# Patient Record
Sex: Male | Born: 1985 | Race: Black or African American | Hispanic: No | Marital: Married | Smoking: Never smoker
Health system: Southern US, Community
[De-identification: ages and names within clinical notes are randomized; demographics above are authoritative.]

---
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
## 2006-06-23 ENCOUNTER — Emergency Department (HOSPITAL_COMMUNITY): Admission: EM | Admit: 2006-06-23 | Discharge: 2006-06-23 | Payer: Self-pay | Admitting: Family Medicine

## 2006-12-20 ENCOUNTER — Emergency Department (HOSPITAL_COMMUNITY): Admission: EM | Admit: 2006-12-20 | Discharge: 2006-12-21 | Payer: Self-pay | Admitting: Emergency Medicine

## 2007-12-10 ENCOUNTER — Emergency Department (HOSPITAL_COMMUNITY): Admission: EM | Admit: 2007-12-10 | Discharge: 2007-12-10 | Payer: Self-pay | Admitting: Emergency Medicine

## 2008-08-04 ENCOUNTER — Emergency Department (HOSPITAL_COMMUNITY): Admission: EM | Admit: 2008-08-04 | Discharge: 2008-08-05 | Payer: Self-pay | Admitting: Emergency Medicine

## 2010-03-03 IMAGING — CR DG CHEST 2V
2 series · 2 of 2 positions shown · non-contrast
Comparison: None

CLINICAL DATA: Fever.  Cough.

CHEST - 2 VIEW

[w chest pa]
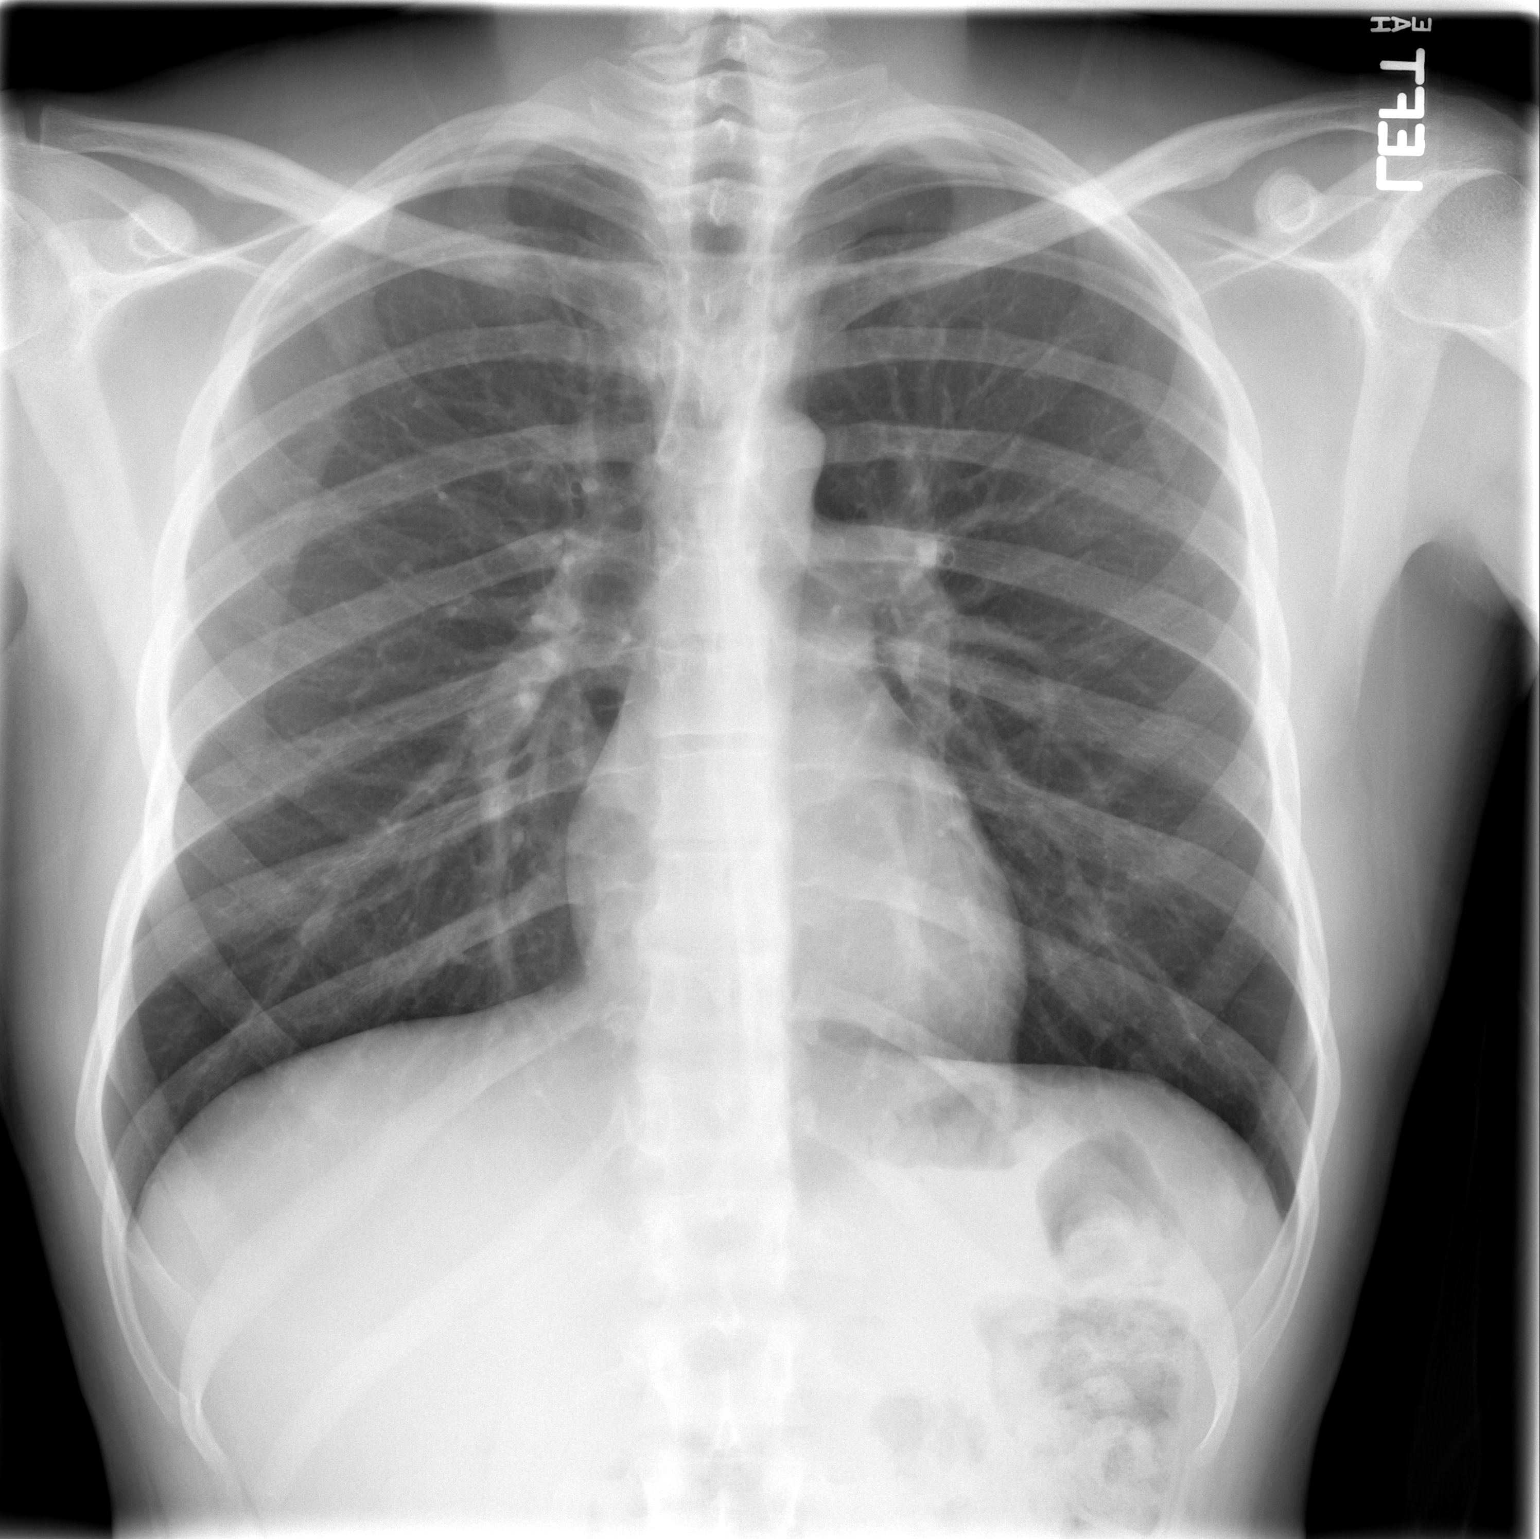

[w chest lat]
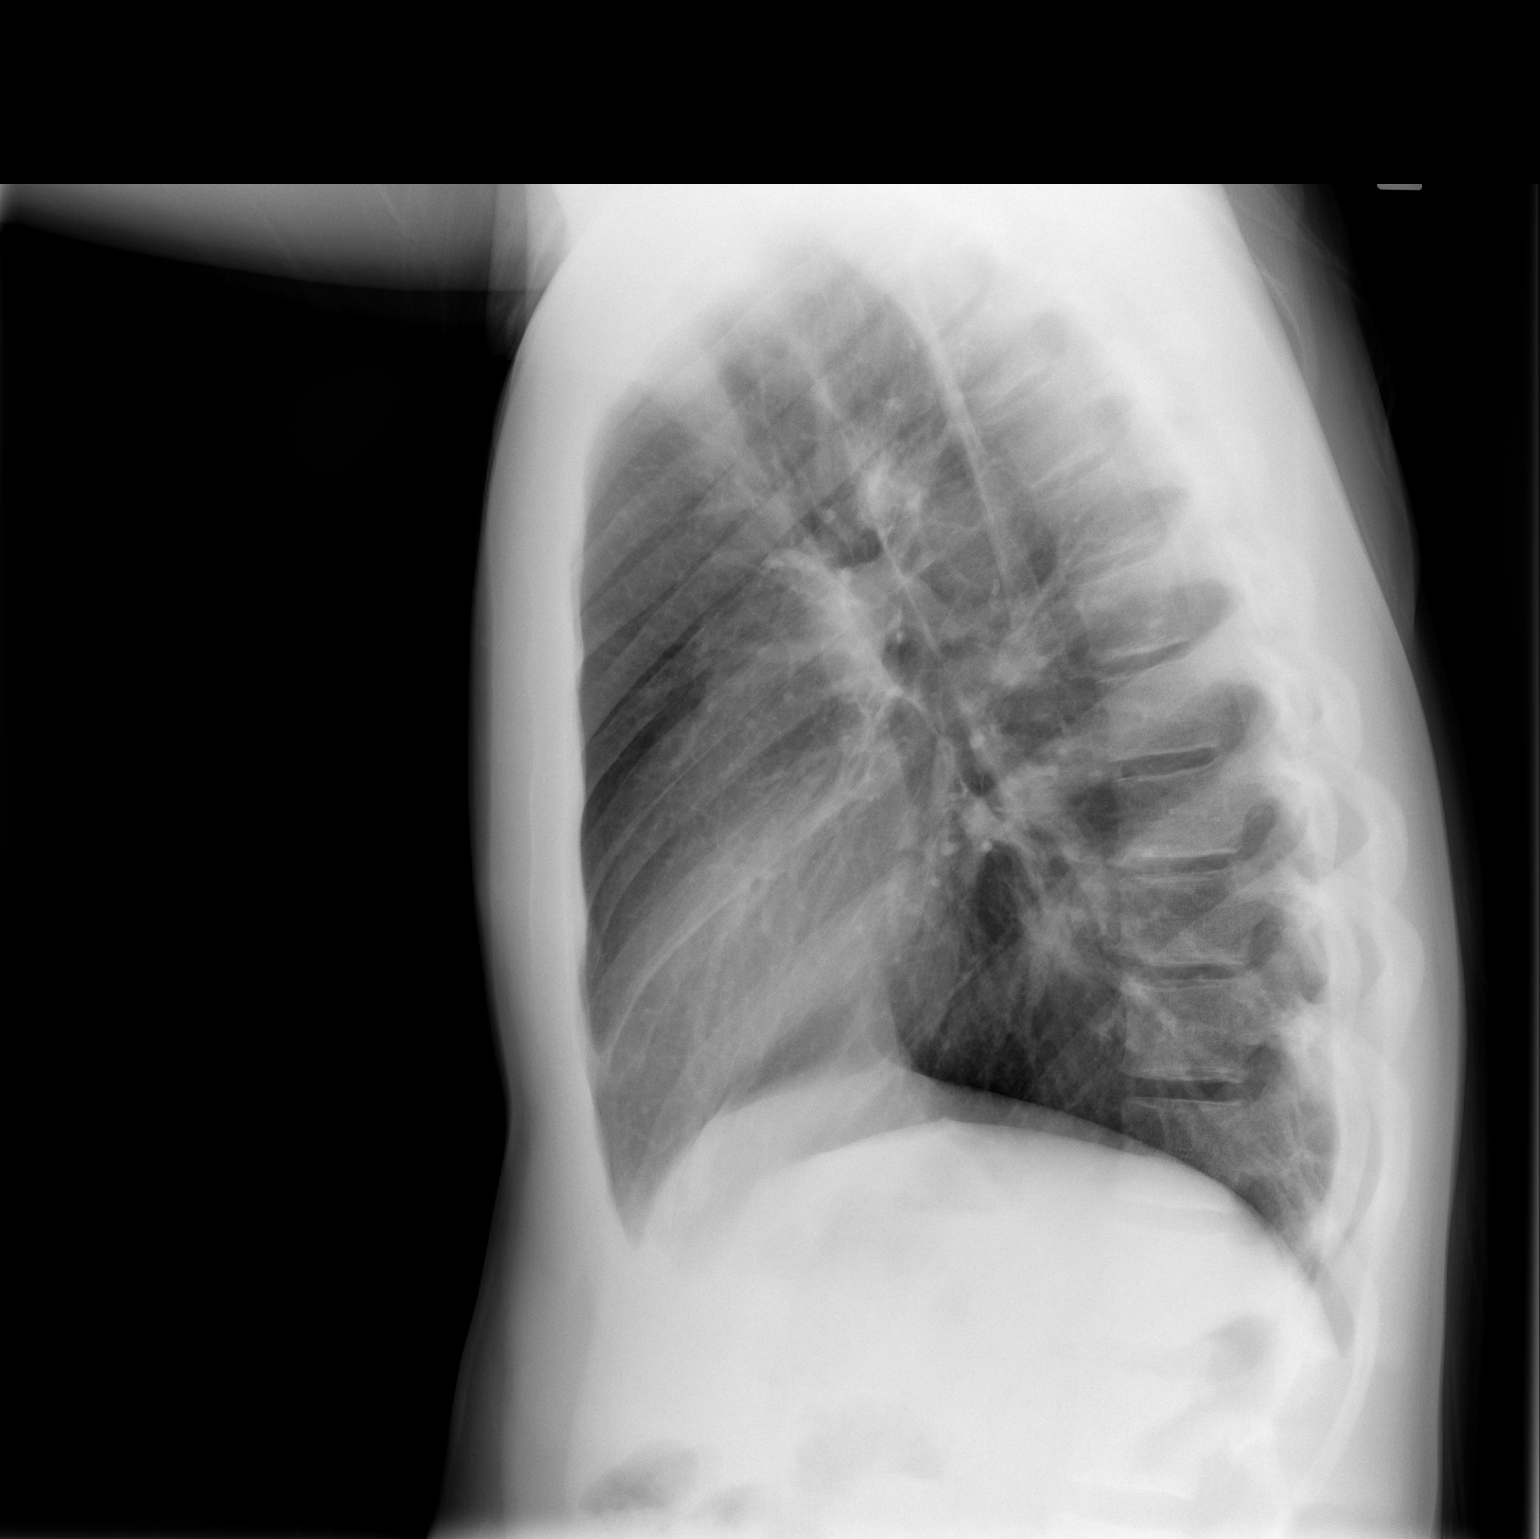

[2 of 2 positions shown; findings below may reference images not displayed]

FINDINGS: Heart size is normal.  The mediastinum is normal.  The
lungs are clear.  The vascularity is normal.  No effusions.  No
bony abnormalities
IMPRESSION: Normal chest

## 2012-06-11 ENCOUNTER — Ambulatory Visit: Payer: Self-pay | Admitting: Family Medicine

## 2012-06-11 VITALS — BP 135/70 | HR 56 | Temp 97.5°F | Resp 18 | Ht 72.5 in | Wt 169.0 lb

## 2012-06-11 DIAGNOSIS — Z0000 Encounter for general adult medical examination without abnormal findings: Secondary | ICD-10-CM

## 2012-06-11 DIAGNOSIS — Z0289 Encounter for other administrative examinations: Secondary | ICD-10-CM

## 2012-06-11 NOTE — Progress Notes (Signed)
[facility] [address], [place] [address] [phone]  Date:  06/11/2012   Name:  Steven Crawford   DOB:  12-20-1985   MRN:  [id number]  PCP:  No primary provider on file.    Chief Complaint: Annual Exam   History of Present Illness:  Steven Crawford is a 27 y.o. 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  He is generally quite healthy, on no chronic meds.  He has never had surgery.  He exercises quite a bit- most days of the week- by running and doing martial arts.  No problems with CP, SOB, or syncope.  He has no concerns about performing the PE physical requirements.  He has not noted any hernia  There is no problem list on file for this patient.   No past medical history on file.  No past surgical history on file.  History  Substance Use Topics  . Smoking status: Never Smoker   . Smokeless tobacco: Not on file  . Alcohol Use: Yes    No family history on file.  No Known Allergies  Medication list has been reviewed and updated.  No current outpatient prescriptions on file prior to visit.    Review of Systems:  As per HPI- otherwise negative.   Physical Examination: Filed Vitals:   06/11/12 1105  BP: 135/70  Pulse: 56  Temp: 97.5 F (36.4 C)  Resp: 18   Filed Vitals:   06/11/12 1105  Height: 6' 0.5" (1.842 m)  Weight: 169 lb (76.658 kg)   Body mass index is 22.61 kg/(m^2). Ideal Body Weight: Weight in (lb) to have BMI = 25: 186.5   GEN: WDWN, NAD, Non-toxic, A & O x 3, appears healthy.   HEENT: Atraumatic, Normocephalic. Neck supple. No masses, No LAD. Bilateral TM wnl, oropharynx normal.  PEERL,EOMI.   Ears and Nose: No external deformity. CV: RRR, No M/G/R. No JVD. No thrill. No extra heart sounds. PULM: CTA B, no wheezes, crackles, rhonchi. No retractions. No resp. distress. No accessory muscle use. ABD: S, NT, ND, +BS. No rebound. No  HSM. EXTR: No c/c/e NEURO Normal gait.  PSYCH: Normally interactive. Conversant. Not depressed or anxious appearing.  Calm demeanor.    Assessment and Plan: 1. 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   Abbe Amsterdam, MD

## 2012-11-19 ENCOUNTER — Ambulatory Visit: Payer: Self-pay | Admitting: Family Medicine

## 2012-11-19 VITALS — BP 112/74 | HR 60 | Temp 98.5°F | Resp 16 | Ht 72.0 in | Wt 172.0 lb

## 2012-11-19 DIAGNOSIS — Z0289 Encounter for other administrative examinations: Secondary | ICD-10-CM

## 2012-11-19 DIAGNOSIS — Z0000 Encounter for general adult medical examination without abnormal findings: Secondary | ICD-10-CM

## 2012-11-19 NOTE — Progress Notes (Signed)
[facility] [address] [place] [address] [phone]  Date:  11/19/2012   Name:  Steven Crawford   DOB:  1985/06/19   MRN:  [id number]  PCP:  No primary provider on file.    Chief Complaint: Annual Exam   History of Present Illness:  Steven Crawford is a 27 y.o. very pleasant male patient who presents with the following:  He is here today for a DOT exam.  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   He takes nyquil at times.   He is otherwise generally healthy  There are no active problems to display for this patient.   History reviewed. No pertinent past medical history.  History reviewed. No pertinent past surgical history.  History  Substance Use Topics  . Smoking status: Never Smoker   . Smokeless tobacco: Not on file  . Alcohol Use: Yes    History reviewed. No pertinent family history.  No Known Allergies  Medication list has been reviewed and updated.  Current Outpatient Prescriptions on File Prior to Visit  Medication Sig Dispense Refill  . Multiple Vitamins-Minerals (CENTRUM PO) Take by mouth.       No current facility-administered medications on file prior to visit.    Review of Systems:  As per HPI- otherwise negative.   Physical Examination: Filed Vitals:   11/19/12 1112  BP: 112/74  Pulse: 58  Temp: 98.5 F (36.9 C)  Resp: 16   Filed Vitals:   11/19/12 1112  Height: 6' (1.829 m)  Weight: 172 lb (78.019 kg)   Body mass index is 23.32 kg/(m^2). Ideal Body Weight: Weight in (lb) to have BMI = 25: 183.9  GEN: WDWN, NAD, Non-toxic, A & O x 3 HEENT: Atraumatic, Normocephalic. Neck supple. No masses, No LAD.  Bilateral TM wnl, oropharynx normal.  PEERL,EOMI.   Ears and Nose: No external deformity. CV: RRR, No M/G/R. No JVD. No thrill. No extra heart sounds. PULM: CTA B, no wheezes, crackles, rhonchi. No retractions. No resp.  distress. No accessory muscle use. ABD: S, NT, ND, +BS. No rebound. No HSM. EXTR: No c/c/e NEURO Normal gait. Normal UE and LE strength, ROM and DTR PSYCH: Normally interactive. Conversant. Not depressed or anxious appearing.  Calm demeanor.  No inguinal hernia  Assessment and Plan: Physical exam  Normal DOT exam- 2 year card.  Instructed not to use nyquil prior to or during driving.   Healthy young man.    Signed Abbe Amsterdam, MD

## 2012-11-19 NOTE — Patient Instructions (Addendum)
Take care!  Your DOT card is good for 2 years from today.   Do not take nyquil prior to or during driving

## 2012-12-15 ENCOUNTER — Encounter: Payer: Self-pay | Admitting: Family Medicine

## 2013-04-18 ENCOUNTER — Telehealth: Payer: Self-pay

## 2013-04-18 NOTE — Telephone Encounter (Signed)
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 In Dr. Cyndie Chime box.

## 2013-04-19 NOTE — Telephone Encounter (Signed)
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  He is in good health and exercises regularly.  No concerns about ability to perform physical requirements

## 2014-11-10 ENCOUNTER — Ambulatory Visit (INDEPENDENT_AMBULATORY_CARE_PROVIDER_SITE_OTHER): Payer: Self-pay | Admitting: Emergency Medicine

## 2014-11-10 VITALS — BP 120/72 | HR 58 | Temp 98.4°F | Resp 18 | Ht 72.0 in | Wt 182.0 lb

## 2014-11-10 DIAGNOSIS — Z024 Encounter for examination for driving license: Secondary | ICD-10-CM

## 2014-11-10 DIAGNOSIS — Z021 Encounter for pre-employment examination: Secondary | ICD-10-CM

## 2014-11-10 NOTE — Progress Notes (Signed)
Subjective:  Patient ID: Steven Crawford, male    DOB: 29-Oct-1985  Age: 29 y.o. 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   He has no past surgical history on file.   His  family history is not on file.  He   reports that he has never smoked. He does not have any smokeless tobacco history on file. He reports that he drinks alcohol. He reports that he does not use illicit drugs.  Outpatient Prescriptions Prior to Visit  Medication Sig Dispense Refill  . Multiple Vitamins-Minerals (CENTRUM PO) Take by mouth.     No facility-administered medications prior to visit.    History   Social History  . Marital Status: Single    Spouse Name: N/A  . Number of Children: N/A  . Years of Education: N/A   Social History Main Topics  . Smoking status: Never Smoker   . Smokeless tobacco: Not on file  . Alcohol Use: Yes  . Drug Use: No  . Sexual Activity: Not on file   Other Topics Concern  . None   Social History Narrative     Review of Systems  Constitutional: Negative for fever, chills and appetite change.  HENT: Negative for congestion, ear pain, postnasal drip, sinus pressure and sore throat.   Eyes: Negative for pain and redness.  Respiratory: Negative for cough, shortness of breath and wheezing.   Cardiovascular: Negative for leg swelling.  Gastrointestinal: Negative for nausea, vomiting, abdominal pain, diarrhea, constipation and blood in stool.  Endocrine: Negative for polyuria.  Genitourinary: Negative for dysuria, urgency, frequency and flank pain.  Musculoskeletal: Negative for gait problem.  Skin: Negative for rash.  Neurological: Negative for weakness and headaches.  Psychiatric/Behavioral: Negative for confusion and decreased concentration. The patient is not nervous/anxious.     Objective:  BP 120/72 mmHg  Pulse 58  Temp(Src) 98.4 F (36.9  C) (Oral)  Resp 18  Ht 6' (1.829 m)  Wt 182 lb (82.555 kg)  BMI 24.68 kg/m2  SpO2 98%  Physical Exam  Constitutional: He is oriented to person, place, and time. He appears well-developed and well-nourished. No distress.  HENT:  Head: Normocephalic and atraumatic.  Right Ear: External ear normal.  Left Ear: External ear normal.  Nose: Nose normal.  Eyes: Conjunctivae and EOM are normal. Pupils are equal, round, and reactive to light. No scleral icterus.  Neck: Normal range of motion. Neck supple. No tracheal deviation present.  Cardiovascular: Normal rate, regular rhythm and normal heart sounds.   Pulmonary/Chest: Effort normal. No respiratory distress. He has no wheezes. He has no rales.  Abdominal: He exhibits no mass. There is no tenderness. There is no rebound and no guarding.  Musculoskeletal: He exhibits no edema.  Lymphadenopathy:    He has no cervical adenopathy.  Neurological: He is alert and oriented to person, place, and time. Coordination normal.  Skin: Skin is warm and dry. No rash noted.  Psychiatric: He has a normal mood and affect. His behavior is normal.      Assessment & Plan:   [name] was seen today for employment physical.  Diagnoses and all orders for this visit:  Encounter for commercial driver medical examination (CDME)   I am having Mr. Cheese maintain his Multiple Vitamins-Minerals (CENTRUM PO) and MELATONIN PO.  Meds ordered this encounter  Medications  . MELATONIN PO  Sig: Take by mouth.    Appropriate red flag conditions were discussed with the patient as well as actions that should be taken.  Patient expressed his understanding.  Follow-up: Return in about 2 years (around 11/09/2016).  [name], MD

## 2020-05-30 DIAGNOSIS — M7661 Achilles tendinitis, right leg: Secondary | ICD-10-CM

## 2020-05-30 HISTORY — DX: Achilles tendinitis, right leg: M76.61

## 2023-06-05 ENCOUNTER — Telehealth: Payer: Self-pay

## 2023-06-05 ENCOUNTER — Ambulatory Visit: Payer: BC Managed Care – PPO | Admitting: General Practice

## 2023-06-05 NOTE — Telephone Encounter (Signed)
 Attempted to call and reach patient to reschedule new patient appt this morning, due to inclement weather [facility] is not opening until 10am today.

## 2023-07-03 ENCOUNTER — Ambulatory Visit (INDEPENDENT_AMBULATORY_CARE_PROVIDER_SITE_OTHER): Payer: BC Managed Care – PPO | Admitting: General Practice

## 2023-07-03 ENCOUNTER — Encounter: Payer: Self-pay | Admitting: General Practice

## 2023-07-03 VITALS — BP 130/88 | HR 79 | Temp 98.0°F | Ht 73.0 in | Wt 222.0 lb

## 2023-07-03 DIAGNOSIS — Z0001 Encounter for general adult medical examination with abnormal findings: Secondary | ICD-10-CM | POA: Diagnosis not present

## 2023-07-03 DIAGNOSIS — Z23 Encounter for immunization: Secondary | ICD-10-CM | POA: Diagnosis not present

## 2023-07-03 DIAGNOSIS — Z13220 Encounter for screening for lipoid disorders: Secondary | ICD-10-CM

## 2023-07-03 DIAGNOSIS — R0789 Other chest pain: Secondary | ICD-10-CM | POA: Insufficient documentation

## 2023-07-03 DIAGNOSIS — Z1159 Encounter for screening for other viral diseases: Secondary | ICD-10-CM | POA: Diagnosis not present

## 2023-07-03 DIAGNOSIS — E119 Type 2 diabetes mellitus without complications: Secondary | ICD-10-CM

## 2023-07-03 DIAGNOSIS — Z114 Encounter for screening for human immunodeficiency virus [HIV]: Secondary | ICD-10-CM | POA: Diagnosis not present

## 2023-07-03 DIAGNOSIS — Z7689 Persons encountering health services in other specified circumstances: Secondary | ICD-10-CM | POA: Insufficient documentation

## 2023-07-03 DIAGNOSIS — Z0000 Encounter for general adult medical examination without abnormal findings: Secondary | ICD-10-CM | POA: Insufficient documentation

## 2023-07-03 DIAGNOSIS — Z833 Family history of diabetes mellitus: Secondary | ICD-10-CM | POA: Insufficient documentation

## 2023-07-03 LAB — LIPID PANEL
Cholesterol: 172 mg/dL (ref 0–200)
HDL: 39.9 mg/dL (ref >39.00)
LDL Cholesterol: 101 mg/dL — ABNORMAL HIGH (ref 0–99)
NonHDL: 131.84
Total CHOL/HDL Ratio: 4
Triglycerides: 155 mg/dL — ABNORMAL HIGH (ref 0.0–149.0)
VLDL: 31 mg/dL (ref 0.0–40.0)

## 2023-07-03 LAB — COMPREHENSIVE METABOLIC PANEL
ALT: 29 U/L (ref 0–53)
AST: 31 U/L (ref 0–37)
Albumin: 4.4 g/dL (ref 3.5–5.2)
Alkaline Phosphatase: 63 U/L (ref 39–117)
BUN: 22 mg/dL (ref 6–23)
CO2: 27 meq/L (ref 19–32)
Calcium: 9.2 mg/dL (ref 8.4–10.5)
Chloride: 103 meq/L (ref 96–112)
Creatinine, Ser: 1.2 mg/dL (ref 0.40–1.50)
GFR: 76.94 mL/min (ref >60.00)
Glucose, Bld: 99 mg/dL (ref 70–99)
Potassium: 4.4 meq/L (ref 3.5–5.1)
Sodium: 138 meq/L (ref 135–145)
Total Bilirubin: 0.3 mg/dL (ref 0.2–1.2)
Total Protein: 7.2 g/dL (ref 6.0–8.3)

## 2023-07-03 LAB — CBC
HCT: 44.4 % (ref 39.0–52.0)
Hemoglobin: 14.4 g/dL (ref 13.0–17.0)
MCHC: 32.4 g/dL (ref 30.0–36.0)
MCV: 87.5 fL (ref 78.0–100.0)
Platelets: 245 10*3/uL (ref 150.0–400.0)
RBC: 5.08 Mil/uL (ref 4.22–5.81)
RDW: 13.7 % (ref 11.5–15.5)
WBC: 5.4 10*3/uL (ref 4.0–10.5)

## 2023-07-03 LAB — HEMOGLOBIN A1C: Hgb A1c MFr Bld: 6.5 % (ref 4.6–6.5)

## 2023-07-03 NOTE — Assessment & Plan Note (Signed)
 EMR reviewed briefly.

## 2023-07-03 NOTE — Assessment & Plan Note (Signed)
Immunizations TDAP given today. Declines flu vaccine.  Discussed the importance of a healthy diet and regular exercise in order for weight loss, and to reduce the risk of further co-morbidity.  Exam stable. Labs pending.  Follow up in 1 year for repeat physical.

## 2023-07-03 NOTE — Progress Notes (Signed)
New Patient Office Visit  Subjective    Patient ID: Steven Crawford, male    DOB: [birth year]  Age: 38 y.o. MRN: [id number]  CC:  Chief Complaint  Patient presents with   New Patient (Initial Visit)   Annual Exam    HPI Steven Crawford is a 38 y.o. male presents to establish care, complete physical and follow up of chronic conditions.  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 Last pcp was several years ago.   Immunizations: -Tetanus: due -Influenza: declines  Diet: Fair diet. 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  Exercise: regular exercise. Kick boxing, physically active at work. Gym on Sundays.   Eye exam: Completes annually (for the DOT physical) Dental exam: several years ago.  Chest pressure: intermittent. Usually occurs once in a while. Only occurs when he lays on his back. Then is resolved when he is on his side or stomach. No chest pain or shortness of breath. Has usually notices it when after eating a big meal. Unable to recall the last episode.    Outpatient Encounter Medications as of 07/03/2023  Medication Sig   Homeopathic Products (ARNICARE ARTHRITIS PO) Take by mouth.   MELATONIN PO Take by mouth.   Multiple Vitamins-Minerals (CENTRUM PO) Take by mouth.   No facility-administered encounter medications on file as of 07/03/2023.    Past Medical History:  Diagnosis Date   Tendonitis, Achilles, right 2022    History reviewed. No pertinent surgical history.  Family History  Problem Relation Age of Onset   Diabetes Mother    Stroke Mother    Heart attack Father 34   Cancer Maternal Grandmother     Social History   Socioeconomic History   Marital status: Married    Spouse name: Osinachi Navarrette   Number of children: 2   Years of education: Not on file   Highest education level: 12th grade  Occupational History   Not on file  Tobacco Use   Smoking status: Never   Smokeless tobacco: Not on file  Vaping  Use   Vaping status: Never Used  Substance and Sexual Activity   Alcohol use: Yes    Alcohol/week: 2.0 standard drinks of alcohol    Types: 2 Cans of beer per week   Drug use: Yes    Types: Marijuana    Comment: quit 8 months ago   Sexual activity: Yes    Partners: Female    Birth control/protection: None, OCP    Comment: wife takes ocp  Other Topics Concern   Not on file  Social History Narrative   Not on file   Social Drivers of Health   Financial Resource Strain: Low Risk  (06/04/2023)   Overall Financial Resource Strain (CARDIA)    Difficulty of Paying Living Expenses: Not hard at all  Food Insecurity: No Food Insecurity (06/04/2023)   Hunger Vital Sign    Worried About Running Out of Food in the Last Year: Never true    Ran Out of Food in the Last Year: Never true  Transportation Needs: No Transportation Needs (06/04/2023)   PRAPARE - Administrator, Civil Service (Medical): No    Lack of Transportation (Non-Medical): No  Physical Activity: Sufficiently Active (06/04/2023)   Exercise Vital Sign    Days of Exercise per Week: 5 days    Minutes of Exercise per Session: 120 min  Stress: No Stress Concern Present (06/04/2023)   [place]  Institute of Occupational Health - Occupational Stress Questionnaire    Feeling of Stress : Not at all  Social Connections: Moderately Isolated (06/04/2023)   Social Connection and Isolation Panel [NHANES]    Frequency of Communication with Friends and Family: More than three times a week    Frequency of Social Gatherings with Friends and Family: Once a week    Attends Religious Services: Never    Database administrator or Organizations: No    Attends Engineer, structural: Not on file    Marital Status: Married  Catering manager Violence: Not on file    Review of Systems  Constitutional:  Negative for chills, fever, malaise/fatigue and weight loss.  HENT:  Negative for congestion, ear discharge, ear pain, hearing loss,  nosebleeds, sinus pain, sore throat and tinnitus.   Eyes:  Negative for blurred vision, double vision, pain, discharge and redness.  Respiratory:  Negative for cough, shortness of breath, wheezing and stridor.   Cardiovascular:  Negative for chest pain, palpitations and leg swelling.  Gastrointestinal:  Negative for abdominal pain, constipation, diarrhea, heartburn, nausea and vomiting.  Genitourinary:  Negative for dysuria, frequency and urgency.  Musculoskeletal:  Negative for myalgias.  Skin:  Negative for rash.  Neurological:  Negative for dizziness, tingling, seizures, weakness and headaches.  Psychiatric/Behavioral:  Negative for depression, substance abuse and suicidal ideas. The patient is not nervous/anxious.         Objective    BP 130/88 (BP Location: Left Arm, Patient Position: Sitting, Cuff Size: Normal)   Pulse 79   Temp 98 F (36.7 C) (Oral)   Ht 6\' 1"  (1.854 m)   Wt 222 lb (100.7 kg)   SpO2 99%   BMI 29.29 kg/m   Physical Exam Vitals and nursing note reviewed.  Constitutional:      Appearance: Normal appearance.  Cardiovascular:     Rate and Rhythm: Normal rate and regular rhythm.     Pulses: Normal pulses.     Heart sounds: Normal heart sounds.  Pulmonary:     Effort: Pulmonary effort is normal.     Breath sounds: Normal breath sounds.  Neurological:     Mental Status: He is alert and oriented to person, place, and time.  Psychiatric:        Mood and Affect: Mood normal.        Behavior: Behavior normal.        Thought Content: Thought content normal.        Judgment: Judgment normal.         Assessment & Plan:  Encounter for screening and preventative care Assessment & Plan: Immunizations TDAP given today. Declines flu vaccine.  Discussed the importance of a healthy diet and regular exercise in order for weight loss, and to reduce the risk of further co-morbidity.  Exam stable. Labs pending.  Follow up in 1 year for repeat physical.    Orders: -     CBC -     Comprehensive metabolic panel  Screening for lipid disorders -     Lipid panel  Family history of diabetes mellitus Assessment & Plan: Hemoglobin A1c pending.  Orders: -     Hemoglobin A1c  Encounter for immunization -     Tdap vaccine greater than or equal to 7yo IM  Encounter to establish care with new doctor Assessment & Plan: EMR reviewed briefly.   Sensation of chest pressure Assessment & Plan: Intermittent. Unclear etiology.  Exam stable.   Suspect GERD as  it usually occurs after a bigger meal. He is not having any symptoms today. Unsure of last episode.  Recommend start a log of symptoms.   Discussed ER/UC precautions.    Need for hepatitis C screening test -     Hepatitis C antibody  Screening for HIV (human immunodeficiency virus) -     HIV Antibody (routine testing w rflx)     Return in about 1 year (around 07/02/2024) for physical.   Modesto Charon, NP

## 2023-07-03 NOTE — Assessment & Plan Note (Signed)
 Hemoglobin A1c pending

## 2023-07-03 NOTE — Patient Instructions (Addendum)
Stop by the lab prior to leaving today. I will notify you of your results once received.   Keep a log of symptoms of the chest pressure and schedule an appointment.  It was a pleasure meeting you!

## 2023-07-03 NOTE — Assessment & Plan Note (Signed)
Intermittent. Unclear etiology.  Exam stable.   Suspect GERD as it usually occurs after a bigger meal. He is not having any symptoms today. Unsure of last episode.  Recommend start a log of symptoms.   Discussed ER/UC precautions.

## 2023-07-04 LAB — HEPATITIS C ANTIBODY: Hepatitis C Ab: NONREACTIVE

## 2023-07-04 LAB — HIV ANTIBODY (ROUTINE TESTING W REFLEX): HIV 1&2 Ab, 4th Generation: NONREACTIVE
